# Patient Record
Sex: Male | Born: 1975 | Race: White | Hispanic: No | Smoking: Never smoker
Health system: Southern US, Community
[De-identification: ages and names within clinical notes are randomized; demographics above are authoritative.]

## PROBLEM LIST (undated history)

## (undated) DIAGNOSIS — G43909 Migraine, unspecified, not intractable, without status migrainosus: Secondary | ICD-10-CM

## (undated) HISTORY — PX: CHOLECYSTECTOMY: SHX55

## (undated) HISTORY — PX: TONSILLECTOMY: SUR1361

---

## 2017-12-08 ENCOUNTER — Emergency Department (HOSPITAL_COMMUNITY): Payer: Self-pay

## 2017-12-08 ENCOUNTER — Emergency Department (HOSPITAL_COMMUNITY)
Admission: EM | Admit: 2017-12-08 | Discharge: 2017-12-08 | Disposition: A | Discharge status: Home / Self Care | Payer: Self-pay | Attending: Emergency Medicine | Admitting: Emergency Medicine

## 2017-12-08 ENCOUNTER — Encounter (HOSPITAL_COMMUNITY): Payer: Self-pay | Admitting: Emergency Medicine

## 2017-12-08 DIAGNOSIS — R0789 Other chest pain: Secondary | ICD-10-CM | POA: Insufficient documentation

## 2017-12-08 HISTORY — DX: Migraine, unspecified, not intractable, without status migrainosus: G43.909

## 2017-12-08 LAB — CBC WITH DIFFERENTIAL/PLATELET
BASOS PCT: 0 %
Basophils Absolute: 0 10*3/uL (ref 0.0–0.1)
EOS ABS: 0.1 10*3/uL (ref 0.0–0.7)
Eosinophils Relative: 1 %
HEMATOCRIT: 47.1 % (ref 39.0–52.0)
Hemoglobin: 16.1 g/dL (ref 13.0–17.0)
Lymphocytes Relative: 27 %
Lymphs Abs: 1.8 10*3/uL (ref 0.7–4.0)
MCH: 31.1 pg (ref 26.0–34.0)
MCHC: 34.2 g/dL (ref 30.0–36.0)
MCV: 91.1 fL (ref 78.0–100.0)
MONO ABS: 0.7 10*3/uL (ref 0.1–1.0)
MONOS PCT: 10 %
Neutro Abs: 4.1 10*3/uL (ref 1.7–7.7)
Neutrophils Relative %: 62 %
Platelets: 140 10*3/uL — ABNORMAL LOW (ref 150–400)
RBC: 5.17 MIL/uL (ref 4.22–5.81)
RDW: 11.9 % (ref 11.5–15.5)
WBC: 6.7 10*3/uL (ref 4.0–10.5)

## 2017-12-08 LAB — COMPREHENSIVE METABOLIC PANEL
ALT: 30 U/L (ref 17–63)
AST: 20 U/L (ref 15–41)
Albumin: 4.5 g/dL (ref 3.5–5.0)
Alkaline Phosphatase: 41 U/L (ref 38–126)
Anion gap: 6 (ref 5–15)
BILIRUBIN TOTAL: 0.8 mg/dL (ref 0.3–1.2)
BUN: 19 mg/dL (ref 6–20)
CALCIUM: 9.6 mg/dL (ref 8.9–10.3)
CO2: 29 mmol/L (ref 22–32)
CREATININE: 1.1 mg/dL (ref 0.61–1.24)
Chloride: 106 mmol/L (ref 101–111)
GFR calc Af Amer: >60 mL/min (ref >60)
Glucose, Bld: 94 mg/dL (ref 65–99)
Potassium: 3.9 mmol/L (ref 3.5–5.1)
Sodium: 141 mmol/L (ref 135–145)
TOTAL PROTEIN: 7.5 g/dL (ref 6.5–8.1)

## 2017-12-08 LAB — LIPASE, BLOOD: LIPASE: 30 U/L (ref 11–51)

## 2017-12-08 LAB — TROPONIN I: Troponin I: <0.03 ng/mL (ref <0.03)

## 2017-12-08 MED ORDER — GI COCKTAIL ~~LOC~~
30.0000 mL | Freq: Once | ORAL | Status: AC
  Administered 2017-12-08: 30 mL via ORAL
  Filled 2017-12-08: qty 30

## 2017-12-08 NOTE — ED Provider Notes (Signed)
[facility]   CSN: [id number] Arrival date & time: 12/08/17  1919     History   Chief Complaint Chief Complaint  Patient presents with  . Chest Pain    HPI Ernest Evans is a 41 y.o. male.  HPI  41 year old male presents with chest pain.  This is been ongoing for about 3 days.  It is essentially continuous but waxes and wanes.  It feels like the pain is just at his sternum and a pressure.  He will blow out his chest and make his sternum/ribs pop which is both audible and something he feels.  He then feels better in his chest but it never seems to go completely away.  Tonight around 5 he did it and then the pain seemed to radiate to his left chest.  He states he started feeling anxiety which is not typical so he came into the ED to be checked out.  He is worried that he has an ulcer because he is also been having some worsening of the symptom eating.  He has been having a lot of chest burning over the last couple weeks despite being on Nexium and Zantac.  There is no shortness of breath.  He denies any significant past medical history besides migraines.  No hypertension, hyperlipidemia, diabetes, smoking, or family history of early coronary disease.  He had a cardiac cath because he states that his heart blood test were positive 10 years ago and was told this was clean.  Has had a cholecystectomy.  Past Medical History:  Diagnosis Date  . Migraines     There are no active problems to display for this patient.   ** The histories are not reviewed yet. Please review them in the "History" navigator section and refresh this SmartLink.     Home Medications    Prior to Admission medications   Not on File    Family History No family history on file.  Social History Social History   Tobacco Use  . Smoking status: Never Smoker  . Smokeless tobacco: Current User    Types: Chew  Substance Use Topics  . Alcohol use: Yes    Comment:  occasionally  . Drug use: Not on file     Allergies   Patient has no known allergies.   Review of Systems Review of Systems  Constitutional: Negative for fever.  Respiratory: Negative for shortness of breath.   Cardiovascular: Positive for chest pain.  Gastrointestinal: Negative for abdominal pain and vomiting.  All other systems reviewed and are negative.    Physical Exam Updated Vital Signs BP 126/88   Pulse 60   Temp 97.7 F (36.5 C) (Oral)   Resp 16   Ht 6\' 1"  (1.854 m)   Wt 133.8 kg (295 lb)   SpO2 96%   BMI 38.92 kg/m   Physical Exam  Constitutional: He is oriented to person, place, and time. He appears well-developed and well-nourished.  HENT:  Head: Normocephalic and atraumatic.  Right Ear: External ear normal.  Left Ear: External ear normal.  Nose: Nose normal.  Eyes: Right eye exhibits no discharge. Left eye exhibits no discharge.  Neck: Neck supple.  Cardiovascular: Normal rate, regular rhythm and normal heart sounds.  Pulmonary/Chest: Effort normal and breath sounds normal. He exhibits no tenderness.  Abdominal: Soft. There is no tenderness.  Musculoskeletal: He exhibits no edema.  Neurological: He is alert and oriented to person, place, and time.  Skin: Skin is warm  and dry.  Nursing note and vitals reviewed.    ED Treatments / Results  Labs (all labs ordered are listed, but only abnormal results are displayed) Labs Reviewed  CBC WITH DIFFERENTIAL/PLATELET - Abnormal; Notable for the following components:      Result Value   Platelets 140 (*)    All other components within normal limits  COMPREHENSIVE METABOLIC PANEL  LIPASE, BLOOD  TROPONIN I    EKG  EKG Interpretation  Date/Time:  Saturday December 08 2017 19:31:34 EST Ventricular Rate:  78 PR Interval:    QRS Duration: 97 QT Interval:  416 QTC Calculation: 474 R Axis:   8 Text Interpretation:  Sinus rhythm no acute ST/T changes No old tracing to compare Confirmed by Pricilla LovelessGoldston,  Wilmetta Speiser [phone]([id number]) on 12/08/2017 7:50:22 PM       Radiology Dg Chest 2 View  Result Date: 12/08/2017 CLINICAL DATA:  Sternal chest pain EXAM: CHEST  2 VIEW COMPARISON:  None. FINDINGS: Low lung volumes. Heart and mediastinal contours are within normal limits. No focal opacities or effusions. No acute bony abnormality. IMPRESSION: Low volumes.  No active cardiopulmonary disease. Electronically Signed   By: Charlett NoseKevin  Dover M.D.   On: 12/08/2017 21:01    Procedures Procedures (including critical care time)  Medications Ordered in ED Medications  gi cocktail (Maalox,Lidocaine,Donnatal) (30 mLs Oral Given 12/08/17 2012)     Initial Impression / Assessment and Plan / ED Course  I have reviewed the triage vital signs and the nursing notes.  Pertinent labs & imaging results that were available during my care of the patient were reviewed by me and considered in my medical decision making (see chart for details).     Patient's chest pain is quite atypical.  Suspicion for ACS is low combined with atypical history and minimal risk factors.  Troponin is negative after several days of symptoms and ECG without acute ischemic changes.  Chest x-ray is benign.  Highly doubt PE.  He does state that he travels a lot once or twice a month from [place] but PE is unlikely otherwise based on his history and physical.  No hypoxia, shortness of breath, or tachycardia.  I do not think workup needed.  He states his PCP is in [place] and he wants to follow-up with them.  Otherwise, he is feeling better and I think stable for discharge.  Discussed return precautions.  Final Clinical Impressions(s) / ED Diagnoses   Final diagnoses:  Atypical chest pain    ED Discharge Orders    None       Pricilla LovelessGoldston, Venecia Mehl, MD 12/08/17 2138

## 2017-12-08 NOTE — ED Triage Notes (Signed)
Pt has been having intermittent central cp for several days. Denies SOB, radiation of pain, dizziness, or diaphoresis. Has used zantac and advil with no relief.

## 2019-05-03 IMAGING — DX DG CHEST 2V
2 series · 2 of 2 positions shown · non-contrast
Comparison: None.

CLINICAL DATA: Sternal chest pain

EXAM:
CHEST  2 VIEW

[chest pa]
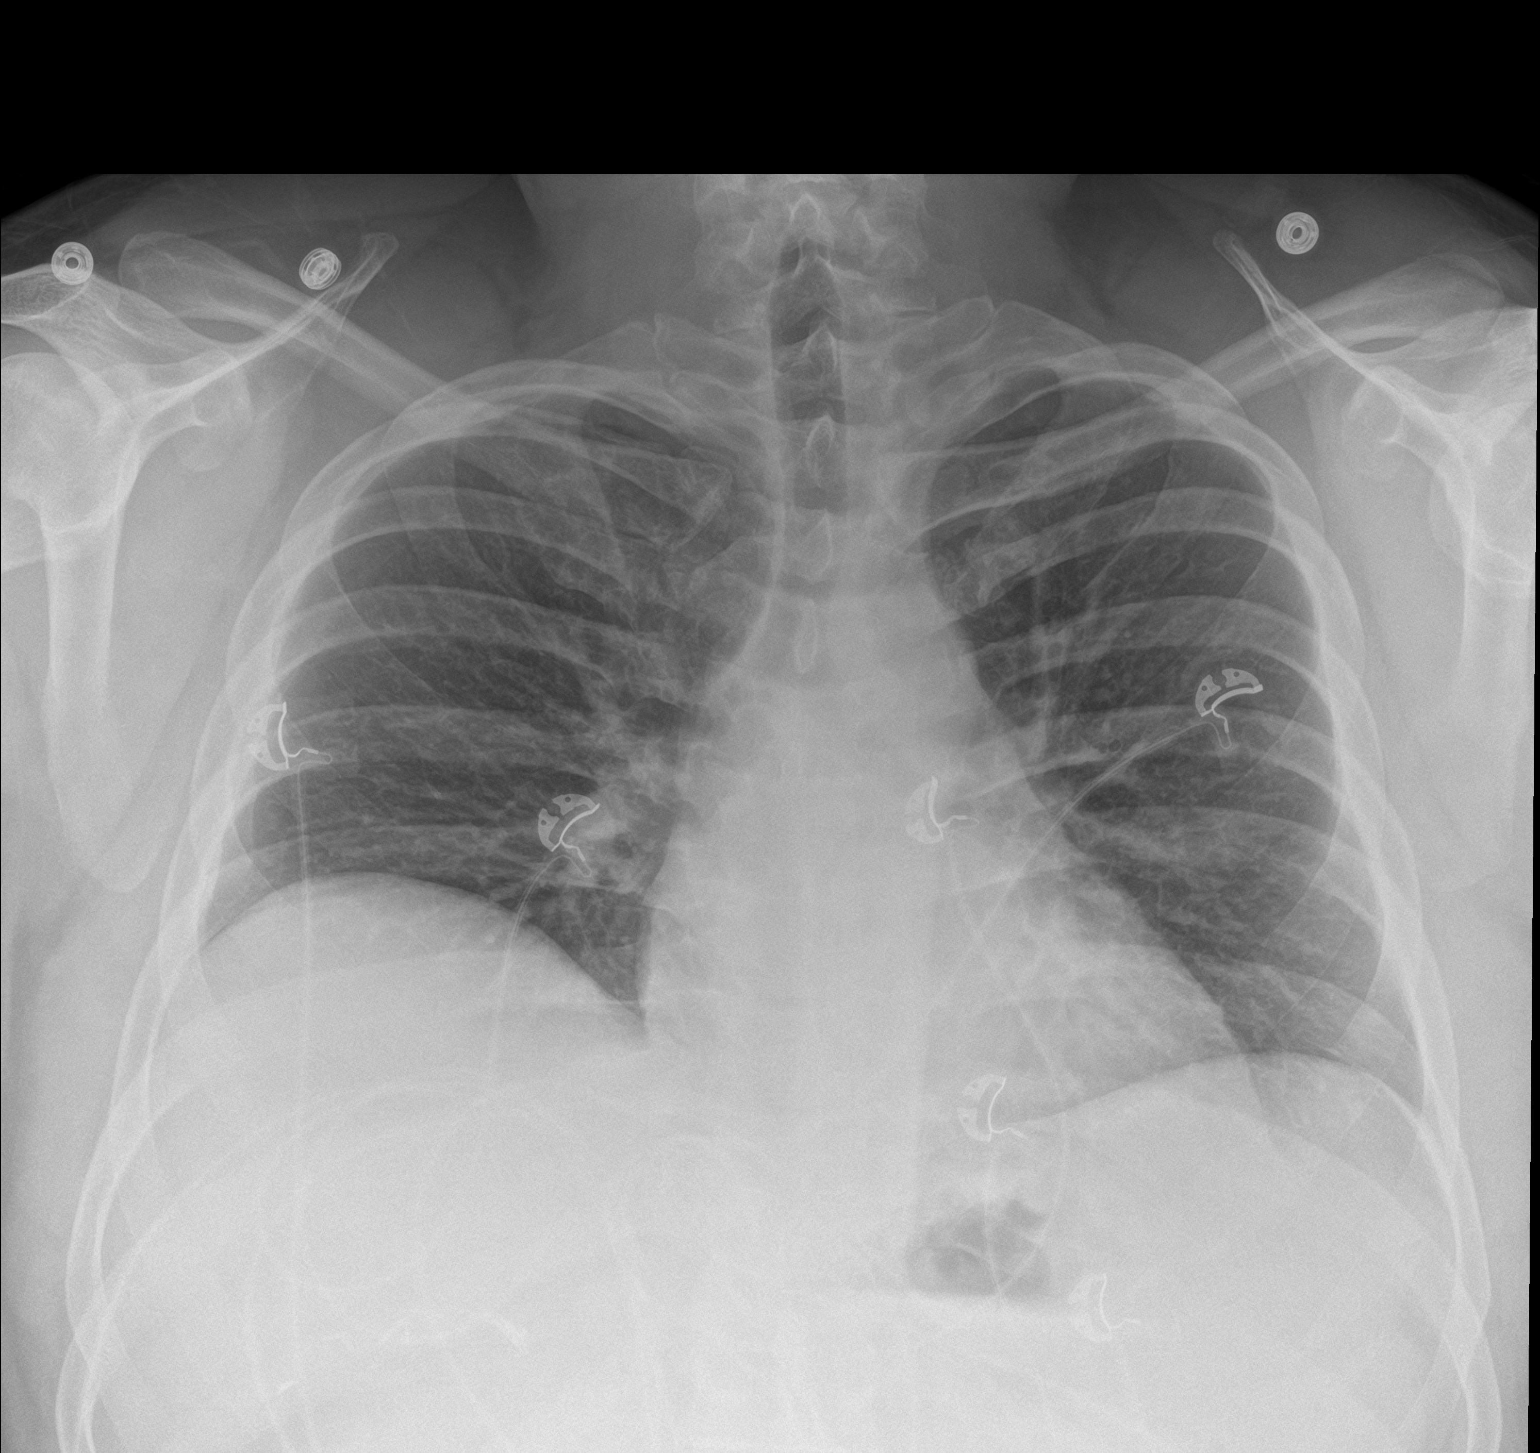

[chest lat]
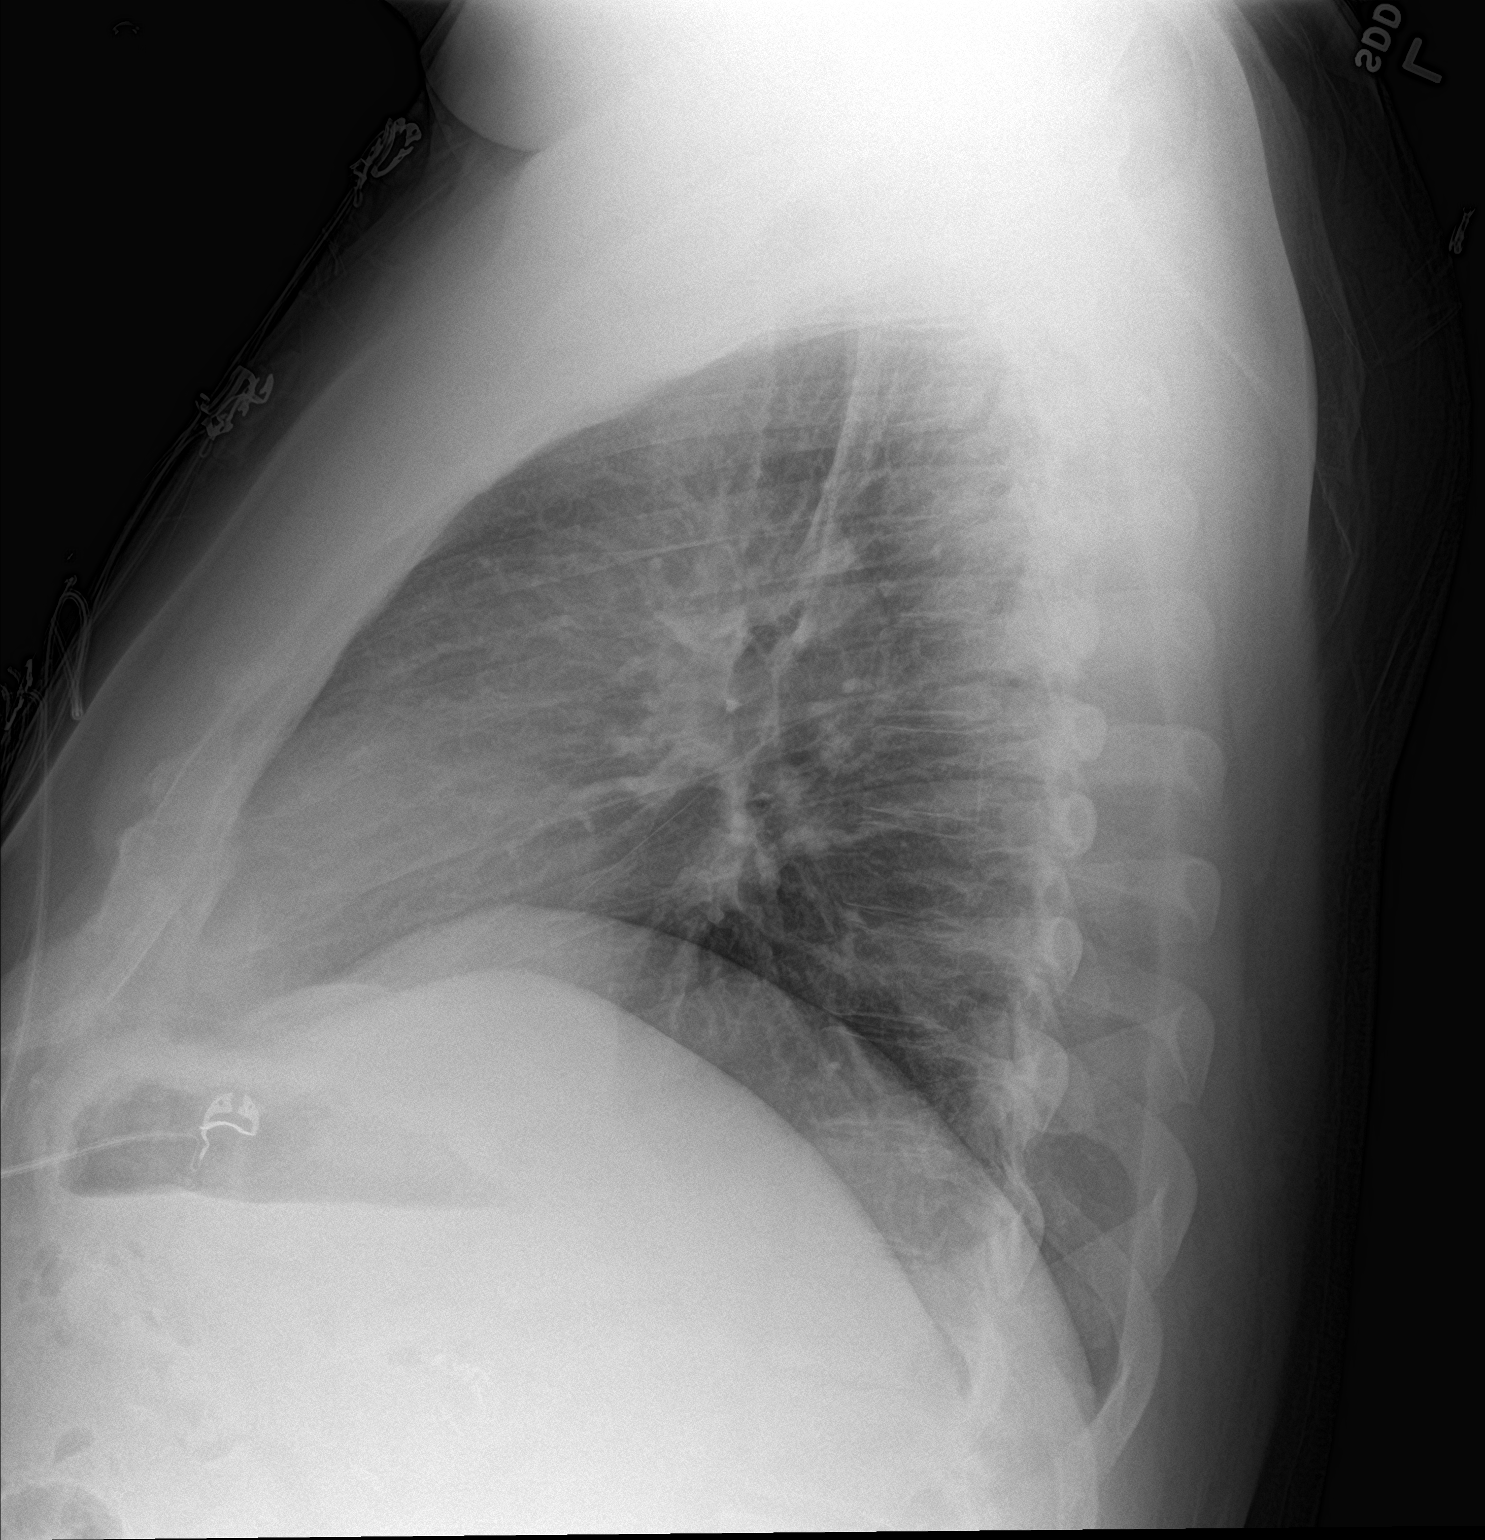

[2 of 2 positions shown; findings below may reference images not displayed]

FINDINGS: Low lung volumes. Heart and mediastinal contours are within normal
limits. No focal opacities or effusions. No acute bony abnormality.
IMPRESSION: Low volumes.  No active cardiopulmonary disease.
